# Patient Record
Sex: Female | Born: 2000 | Race: Black or African American | Hispanic: No | Marital: Single | Smoking: Current some day smoker
Health system: Southern US, Community
[De-identification: ages and names within clinical notes are randomized; demographics above are authoritative.]

## PROBLEM LIST (undated history)

## (undated) DIAGNOSIS — J45909 Unspecified asthma, uncomplicated: Secondary | ICD-10-CM

## (undated) HISTORY — PX: NO PAST SURGERIES: SHX2092

---

## 2020-06-06 ENCOUNTER — Other Ambulatory Visit: Payer: Self-pay

## 2020-06-06 ENCOUNTER — Encounter: Payer: Self-pay | Admitting: Emergency Medicine

## 2020-06-06 ENCOUNTER — Ambulatory Visit
Admission: EM | Admit: 2020-06-06 | Discharge: 2020-06-06 | Disposition: A | Discharge status: Home / Self Care | Payer: Medicaid Other | Attending: Internal Medicine | Admitting: Internal Medicine

## 2020-06-06 DIAGNOSIS — N912 Amenorrhea, unspecified: Secondary | ICD-10-CM | POA: Insufficient documentation

## 2020-06-06 LAB — PREGNANCY, URINE: Preg Test, Ur: POSITIVE — AB

## 2020-06-06 NOTE — ED Provider Notes (Signed)
[facility]    CSN: [id number] Arrival date & time: 06/06/20  1714      History   Chief Complaint Chief Complaint  Patient presents with  . Amenorrhea    HPI Gina Johnson is a 19 y.o. female who present due to missing her cycle this week. She things she had her last period 8/30th. Has not been doing anything to prevent pregnancy. Did plan B 5 days ago, but has not bled at all.     History reviewed. No pertinent past medical history.  There are no problems to display for this patient.   Past Surgical History:  Procedure Laterality Date  . NO PAST SURGERIES      OB History   No obstetric history on file.      Home Medications    Prior to Admission medications   Not on File    Family History Family History  Problem Relation Age of Onset  . Diabetes Mother   . Hypertension Mother   . Asthma Mother   . Healthy Father     Social History Social History   Tobacco Use  . Smoking status: Current Some Day Smoker  . Smokeless tobacco: Never Used  Vaping Use  . Vaping Use: Never used  Substance Use Topics  . Alcohol use: Yes  . Drug use: Yes    Types: Marijuana     Allergies   Patient has no known allergies.   Review of Systems Review of Systems   Physical Exam Triage Vital Signs ED Triage Vitals  Enc Vitals Group     BP 06/06/20 1748 107/68     Pulse Rate 06/06/20 1748 79     Resp 06/06/20 1748 18     Temp 06/06/20 1748 98.8 F (37.1 C)     Temp Source 06/06/20 1748 Oral     SpO2 06/06/20 1748 98 %     Weight 06/06/20 1745 230 lb (104.3 kg)     Height 06/06/20 1745 5\' 3"  (1.6 m)     Head Circumference --      Peak Flow --      Pain Score 06/06/20 1745 0     Pain Loc --      Pain Edu? --      Excl. in GC? --    No data found.  Updated Vital Signs BP 107/68 (BP Location: Left Arm)   Pulse 79   Temp 98.8 F (37.1 C) (Oral)   Resp 18   Ht 5\' 3"  (1.6 m)   Wt 230 lb (104.3 kg)   LMP 05/06/2020 (Approximate)   SpO2  98%   BMI 40.74 kg/m   Visual Acuity Right Eye Distance:   Left Eye Distance:   Bilateral Distance:    Right Eye Near:   Left Eye Near:    Bilateral Near:     Physical Exam   UC Treatments / Results  Labs (all labs ordered are listed, but only abnormal results are displayed) Labs Reviewed  PREGNANCY, URINE - Abnormal; Notable for the following components:      Result Value   Preg Test, Ur POSITIVE (*)    All other components within normal limits    EKG   Radiology No results found.  Procedures Procedures (including critical care time)  Medications Ordered in UC Medications - No data to display  Initial Impression / Assessment and Plan / UC Course  I have reviewed the triage vital signs and the nursing notes.  Pertinent  labs & imaging results that were available during my care of the patient were reviewed by me and considered in my medical decision making (see chart for details).      Final Clinical Impressions(s) / UC Diagnoses   Final diagnoses:  None   Discharge Instructions   None    ED Prescriptions    None     PDMP not reviewed this encounter.   Garey Ham, [place] 06/06/20 1837

## 2020-06-06 NOTE — Discharge Instructions (Addendum)
You are possibly 4 1/[redacted] weeks pregnant and your due date is February 10 2021.  Get yourself prenatal vitamins.  Make an appointment with your OB Dr for check up.

## 2020-06-06 NOTE — ED Triage Notes (Signed)
Pt states her period was suppose to start this week but she has not started. She states she also took a plan B less than a week ago. She has not taken any pregnancy test at home. She has had back pain, nausea and diarrhea. She states she had these same symptoms with her baby.

## 2020-08-11 ENCOUNTER — Other Ambulatory Visit: Payer: Self-pay

## 2020-08-11 ENCOUNTER — Emergency Department
Admission: EM | Admit: 2020-08-11 | Discharge: 2020-08-11 | Disposition: A | Discharge status: Home / Self Care | Payer: Medicaid Other | Attending: Emergency Medicine | Admitting: Emergency Medicine

## 2020-08-11 ENCOUNTER — Emergency Department: Payer: Medicaid Other

## 2020-08-11 DIAGNOSIS — J069 Acute upper respiratory infection, unspecified: Secondary | ICD-10-CM | POA: Insufficient documentation

## 2020-08-11 DIAGNOSIS — F17200 Nicotine dependence, unspecified, uncomplicated: Secondary | ICD-10-CM | POA: Diagnosis not present

## 2020-08-11 DIAGNOSIS — Z3A12 12 weeks gestation of pregnancy: Secondary | ICD-10-CM | POA: Diagnosis not present

## 2020-08-11 DIAGNOSIS — Z20822 Contact with and (suspected) exposure to covid-19: Secondary | ICD-10-CM | POA: Diagnosis not present

## 2020-08-11 DIAGNOSIS — J45909 Unspecified asthma, uncomplicated: Secondary | ICD-10-CM | POA: Insufficient documentation

## 2020-08-11 DIAGNOSIS — A5901 Trichomonal vulvovaginitis: Secondary | ICD-10-CM | POA: Insufficient documentation

## 2020-08-11 DIAGNOSIS — O99511 Diseases of the respiratory system complicating pregnancy, first trimester: Secondary | ICD-10-CM | POA: Diagnosis not present

## 2020-08-11 DIAGNOSIS — O23591 Infection of other part of genital tract in pregnancy, first trimester: Secondary | ICD-10-CM | POA: Insufficient documentation

## 2020-08-11 HISTORY — DX: Unspecified asthma, uncomplicated: J45.909

## 2020-08-11 LAB — URINALYSIS, COMPLETE (UACMP) WITH MICROSCOPIC
Bilirubin Urine: NEGATIVE
Glucose, UA: NEGATIVE mg/dL
Hgb urine dipstick: NEGATIVE
Ketones, ur: NEGATIVE mg/dL
Leukocytes,Ua: NEGATIVE
Nitrite: NEGATIVE
Protein, ur: NEGATIVE mg/dL
Specific Gravity, Urine: 1.03 (ref 1.005–1.030)
pH: 5 (ref 5.0–8.0)

## 2020-08-11 LAB — GROUP A STREP BY PCR: Group A Strep by PCR: NOT DETECTED

## 2020-08-11 LAB — WET PREP, GENITAL
Clue Cells Wet Prep HPF POC: NONE SEEN
Sperm: NONE SEEN
Yeast Wet Prep HPF POC: NONE SEEN

## 2020-08-11 LAB — COMPREHENSIVE METABOLIC PANEL
ALT: 14 U/L (ref 0–44)
AST: 15 U/L (ref 15–41)
Albumin: 3.6 g/dL (ref 3.5–5.0)
Alkaline Phosphatase: 81 U/L (ref 38–126)
Anion gap: 8 (ref 5–15)
BUN: 7 mg/dL (ref 6–20)
CO2: 21 mmol/L — ABNORMAL LOW (ref 22–32)
Calcium: 9.2 mg/dL (ref 8.9–10.3)
Chloride: 108 mmol/L (ref 98–111)
Creatinine, Ser: 0.56 mg/dL (ref 0.44–1.00)
GFR, Estimated: >60 mL/min (ref >60)
Glucose, Bld: 84 mg/dL (ref 70–99)
Potassium: 3.7 mmol/L (ref 3.5–5.1)
Sodium: 137 mmol/L (ref 135–145)
Total Bilirubin: 0.5 mg/dL (ref 0.3–1.2)
Total Protein: 7.7 g/dL (ref 6.5–8.1)

## 2020-08-11 LAB — CBC WITH DIFFERENTIAL/PLATELET
Abs Immature Granulocytes: 0.02 10*3/uL (ref 0.00–0.07)
Basophils Absolute: 0 10*3/uL (ref 0.0–0.1)
Basophils Relative: 1 %
Eosinophils Absolute: 0.3 10*3/uL (ref 0.0–0.5)
Eosinophils Relative: 4 %
HCT: 34.5 % — ABNORMAL LOW (ref 36.0–46.0)
Hemoglobin: 11.7 g/dL — ABNORMAL LOW (ref 12.0–15.0)
Immature Granulocytes: 0 %
Lymphocytes Relative: 18 %
Lymphs Abs: 1.6 10*3/uL (ref 0.7–4.0)
MCH: 30.1 pg (ref 26.0–34.0)
MCHC: 33.9 g/dL (ref 30.0–36.0)
MCV: 88.7 fL (ref 80.0–100.0)
Monocytes Absolute: 0.5 10*3/uL (ref 0.1–1.0)
Monocytes Relative: 6 %
Neutro Abs: 6 10*3/uL (ref 1.7–7.7)
Neutrophils Relative %: 71 %
Platelets: 374 10*3/uL (ref 150–400)
RBC: 3.89 MIL/uL (ref 3.87–5.11)
RDW: 12.3 % (ref 11.5–15.5)
WBC: 8.5 10*3/uL (ref 4.0–10.5)
nRBC: 0 % (ref 0.0–0.2)

## 2020-08-11 LAB — RESP PANEL BY RT-PCR (FLU A&B, COVID) ARPGX2
Influenza A by PCR: NEGATIVE
Influenza B by PCR: NEGATIVE
SARS Coronavirus 2 by RT PCR: NEGATIVE

## 2020-08-11 LAB — HCG, QUANTITATIVE, PREGNANCY: hCG, Beta Chain, Quant, S: 51637 m[IU]/mL — ABNORMAL HIGH (ref <5)

## 2020-08-11 MED ORDER — METRONIDAZOLE 500 MG PO TABS
500.0000 mg | ORAL_TABLET | Freq: Once | ORAL | Status: AC
  Administered 2020-08-11: 500 mg via ORAL
  Filled 2020-08-11: qty 1

## 2020-08-11 MED ORDER — METRONIDAZOLE 500 MG PO TABS: 500.0000 mg | ORAL_TABLET | Freq: Two times a day (BID) | ORAL | 0 refills | Status: AC

## 2020-08-11 NOTE — ED Triage Notes (Signed)
Pt states that she is about [redacted] weeks pregnant and had a pap smear and has since been having some abdominal cramping and yellowish discharge- pt states she also has flu like symptoms which she states she got from her son- pt states she has had a negative covid test- pt also c/o a stye on her right eye

## 2020-08-11 NOTE — ED Notes (Signed)
Patient is pregnant, due February 23, 2020

## 2020-08-11 NOTE — ED Provider Notes (Signed)
[facility] Emergency Department Provider Note  ___________________________________________   First MD Initiated Contact with Patient 08/11/20 1728     (approximate)  I have reviewed the triage vital signs and the nursing notes.   HISTORY  Chief Complaint Vaginal Discharge  HPI Gina Johnson is a 19 y.o. female , G2 P1 ,who presents to the emergency department for evaluation of vaginal discharge.  The patient states that she is [redacted] weeks pregnant and recently had a Pap smear and since that time has been having some abdominal cramping with some yellowish discharge.  She also states that she has been feeling some nasal congestion, sore throat and feeling very rundown with body aches.  She denies fever, cough or shortness of breath.  She states that when she had the Pap smear for her pregnancy, she was also tested for STDs so she is unsure why she is having these symptoms.  She describes the discharge as thick and a white/yellow.  She denies any bloody discharge, sudden gush of fluids.  She denies any known complications to this pregnancy.          Past Medical History:  Diagnosis Date  . Asthma     There are no problems to display for this patient.   Past Surgical History:  Procedure Laterality Date  . NO PAST SURGERIES      Prior to Admission medications   Medication Sig Start Date End Date Taking? Authorizing Provider  metroNIDAZOLE (FLAGYL) 500 MG tablet Take 1 tablet (500 mg total) by mouth 2 (two) times daily for 7 days. 08/11/20 08/18/20  Lucy Chris, PA    Allergies Patient has no known allergies.  Family History  Problem Relation Age of Onset  . Diabetes Mother   . Hypertension Mother   . Asthma Mother   . Healthy Father     Social History Social History   Tobacco Use  . Smoking status: Current Some Day Smoker  . Smokeless tobacco: Never Used  Vaping Use  . Vaping Use: Never used  Substance Use Topics  . Alcohol use:  Yes  . Drug use: Yes    Types: Marijuana    Review of Systems Constitutional: + Body aches, no fever/chills Eyes: No visual changes. ENT: + sore throat. + Nasal congestion Cardiovascular: Denies chest pain. Respiratory: Denies shortness of breath. Gastrointestinal: No abdominal pain.  No nausea, no vomiting.  No diarrhea.  No constipation. Genitourinary: + Vaginal discharge, negative for dysuria. Musculoskeletal: Negative for back pain. Skin: Negative for rash. Neurological: Negative for headaches, focal weakness or numbness.   ____________________________________________   PHYSICAL EXAM:  VITAL SIGNS: ED Triage Vitals  Enc Vitals Group     BP 08/11/20 1510 131/66     Pulse Rate 08/11/20 1510 86     Resp 08/11/20 1510 18     Temp 08/11/20 1510 98.2 F (36.8 C)     Temp Source 08/11/20 1510 Oral     SpO2 08/11/20 1510 99 %     Weight 08/11/20 1509 229 lb (103.9 kg)     Height 08/11/20 1509 5\' 3"  (1.6 m)     Head Circumference --      Peak Flow --      Pain Score 08/11/20 1508 8     Pain Loc --      Pain Edu? --      Excl. in GC? --     Constitutional: Alert and oriented. Well appearing and in no acute  distress. Eyes: Conjunctivae are normal. PERRL. EOMI. Head: Atraumatic. Nose: Mild congestion/rhinnorhea. Mouth/Throat: Mucous membranes are moist.  Oropharynx erythematous without any tonsillar exudate or enlargement. Neck: No stridor.   Lymphatic: Mild bilateral anterior cervical lymphadenopathy. Cardiovascular: Normal rate, regular rhythm. Grossly normal heart sounds.  Good peripheral circulation. Respiratory: Normal respiratory effort.  No retractions. Lungs CTAB. Gastrointestinal: Soft and nontender. No distention. No abdominal bruits. No CVA tenderness. Genitourinary: Deferred. Musculoskeletal: No lower extremity tenderness nor edema.  No joint effusions. Neurologic:  Normal speech and language. No gross focal neurologic deficits are appreciated. No gait  instability. Skin:  Skin is warm, dry and intact. No rash noted. Psychiatric: Mood and affect are normal. Speech and behavior are normal.  ____________________________________________   LABS (all labs ordered are listed, but only abnormal results are displayed)  Labs Reviewed  WET PREP, GENITAL - Abnormal; Notable for the following components:      Result Value   Trich, Wet Prep PRESENT (*)    WBC, Wet Prep HPF POC FEW (*)    All other components within normal limits  CBC WITH DIFFERENTIAL/PLATELET - Abnormal; Notable for the following components:   Hemoglobin 11.7 (*)    HCT 34.5 (*)    All other components within normal limits  COMPREHENSIVE METABOLIC PANEL - Abnormal; Notable for the following components:   CO2 21 (*)    All other components within normal limits  URINALYSIS, COMPLETE (UACMP) WITH MICROSCOPIC - Abnormal; Notable for the following components:   Color, Urine YELLOW (*)    APPearance HAZY (*)    Bacteria, UA RARE (*)    All other components within normal limits  HCG, QUANTITATIVE, PREGNANCY - Abnormal; Notable for the following components:   hCG, Beta Chain, Quant, S 51,637 (*)    All other components within normal limits  RESP PANEL BY RT-PCR (FLU A&B, COVID) [id number]  GROUP A STREP BY PCR  URINE CULTURE    ____________________________________________  RADIOLOGY  Official radiology report(s): US OB Comp Less 14 Wks  Result Date: 08/11/2020 CLINICAL DATA:  Initial evaluation for abdominal cramping, early pregnancy. EXAM: OBSTETRIC <14 WK ULTRASOUND TECHNIQUE: Transabdominal ultrasound was performed for evaluation of the gestation as well as the maternal uterus and adnexal regions. COMPARISON:  None. FINDINGS: Intrauterine gestational sac: Single Yolk sac:  Negative. Embryo:  Present. Cardiac Activity: Present. Heart Rate: 155 bpm CRL: 71.2 mm   13 w 2 d                  [place] EDC: 02/14/2021 Subchorionic hemorrhage:  None visualized. Maternal uterus/adnexae: Right  ovary within normal limits. Left ovary not visualized. No adnexal mass or free fluid. IMPRESSION: 1. Single viable intrauterine pregnancy as above, estimated gestational age [redacted] weeks and 2 days by crown-rump length, with ultrasound EDC of 02/14/2021. No complication. 2. No other acute maternal uterine or adnexal abnormality. Electronically Signed   By: Rise Mu M.D.   On: 08/11/2020 18:51    ____________________________________________   INITIAL IMPRESSION / ASSESSMENT AND PLAN / ED COURSE  As part of my medical decision making, I reviewed the following data within the electronic MEDICAL RECORD NUMBER Nursing notes reviewed and incorporated        Patient is a 19 year old G2, P1 who presents to the emergency department for evaluation of vaginal discharge that she feels has been worse since her recent Pap smear and OB work-up in the first trimester.  She complains of some mild abdominal cramping that has been present  since the time of the Pap smear.  She denies any severe abdominal pain, fever, nausea vomiting or diarrhea.  She is also having some nasal congestion and body aches.  Physical exam does show some mild nasal congestion erythematous oropharynx as well as some shotty bilateral cervical lymphadenopathy.  Work-up in the emergency room consisted of a respiratory panel, group A strep throat swab, genital wet prep, CMP, CBC and urinalysis.  CBC, CMP and urinalysis are grossly unremarkable.  The patient's respiratory panel and group A strep are negative.  Beta hCG quantitative is consistent with how far along she believes she is.  The genital wet prep did show the presence of trichomoniasis.  Given her abdominal cramping, pelvic ultrasound was ordered and no abnormal obstetric findings were identified.  Will treat the patient with metronidazole 500 twice daily x7 days per pregnancy recommendations.  We will also recommend that the patient follow-up closely with her OB.  The patient is amenable  with this plan she is stable at this time for outpatient therapy.      ____________________________________________   FINAL CLINICAL IMPRESSION(S) / ED DIAGNOSES  Final diagnoses:  Trichomonal vaginitis during pregnancy in first trimester  Viral URI     ED Discharge Orders         Ordered    metroNIDAZOLE (FLAGYL) 500 MG tablet  2 times daily        08/11/20 2103          *Please note:  Kyeisha P Zwart was evaluated in Emergency Department on 08/11/2020 for the symptoms described in the history of present illness. She was evaluated in the context of the global COVID-19 pandemic, which necessitated consideration that the patient might be at risk for infection with the SARS-CoV-2 virus that causes COVID-19. Institutional protocols and algorithms that pertain to the evaluation of patients at risk for COVID-19 are in a state of rapid change based on information released by regulatory bodies including the CDC and federal and state organizations. These policies and algorithms were followed during the patient's care in the ED.  Some ED evaluations and interventions may be delayed as a result of limited staffing during and the pandemic.*   Note:  This document was prepared using Dragon voice recognition software and may include unintentional dictation errors.    Lucy Chris, PA 08/11/20 2340    Phineas Semen, MD 08/19/20 [phone]

## 2020-08-11 NOTE — ED Notes (Signed)
Pt given swab to self swab for wet prep

## 2020-08-13 LAB — URINE CULTURE: Culture: <10000 — AB

## 2021-08-10 IMAGING — US US OB COMP LESS 14 WK
1 series · 14 of 28 positions shown · non-contrast
Comparison: None.

CLINICAL DATA: Initial evaluation for abdominal cramping, early
pregnancy.

EXAM:
OBSTETRIC <14 WK ULTRASOUND
TECHNIQUE: Transabdominal ultrasound was performed for evaluation of the
gestation as well as the maternal uterus and adnexal regions.

[Series 1: us ob comp less 14 wks · 14 of 48 slices shown]
[im 2/48]
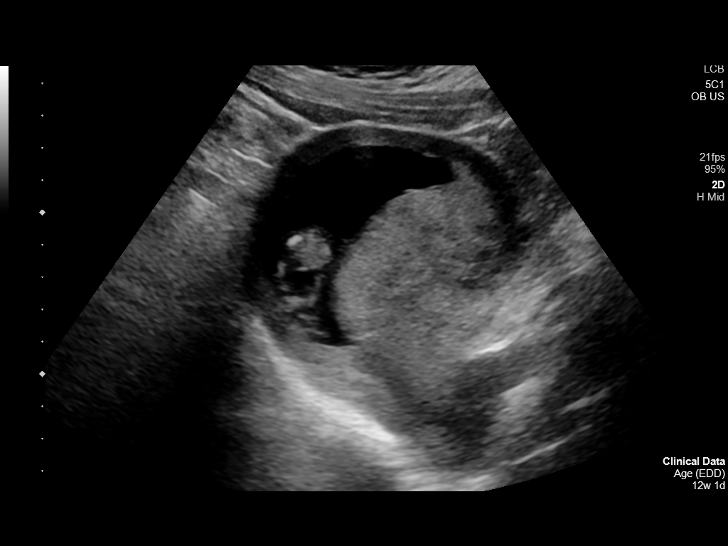
[im 6/48]
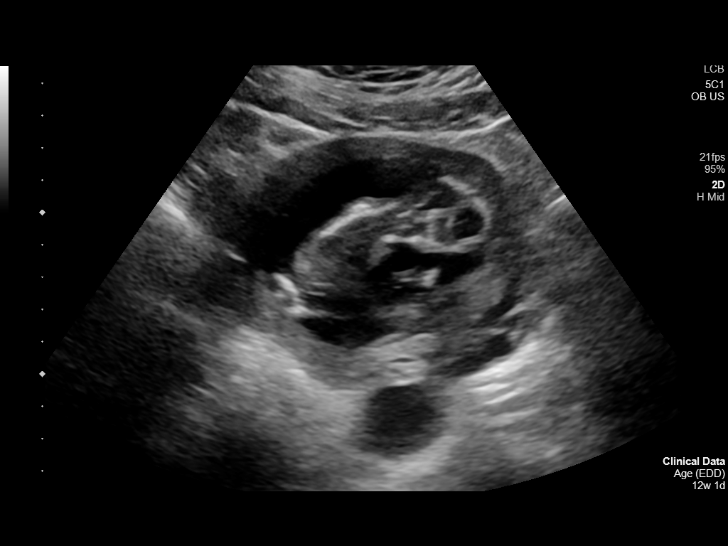
[im 9/48]
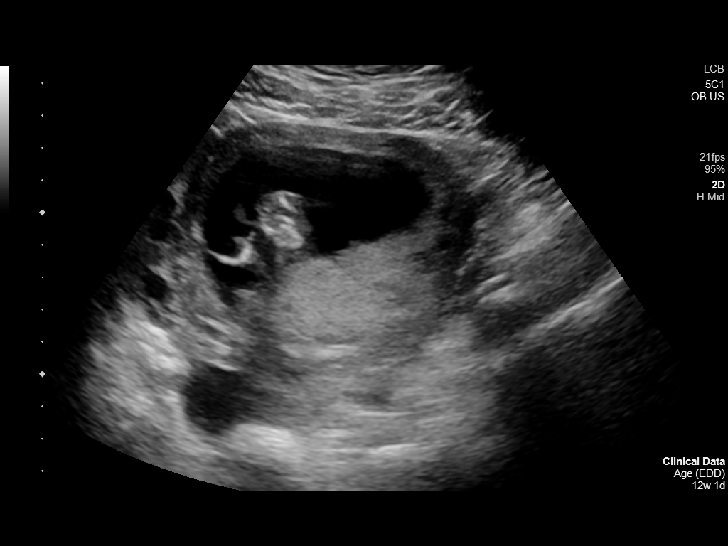
[im 13/48]
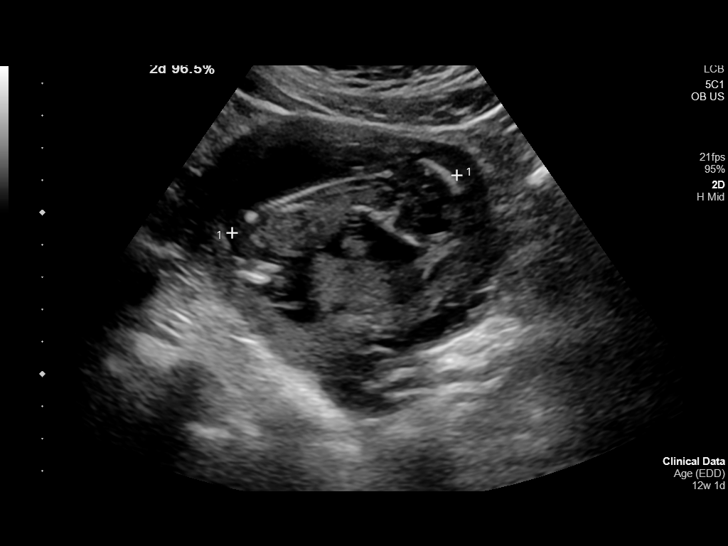
[im 16/48]
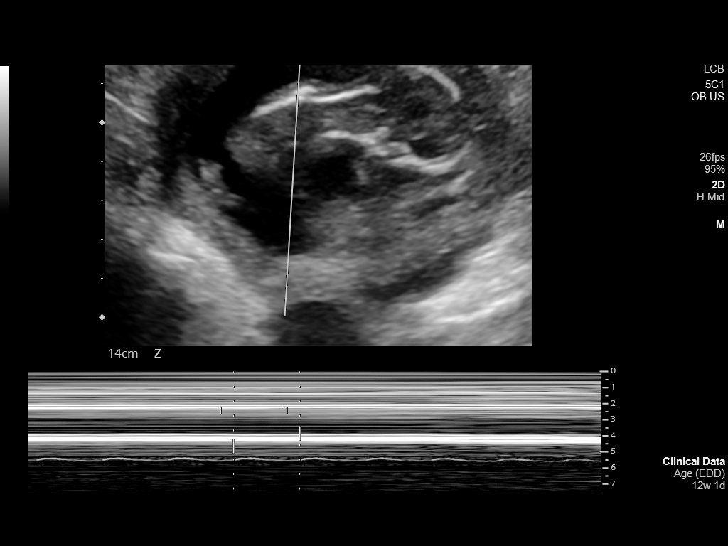
[im 20/48]
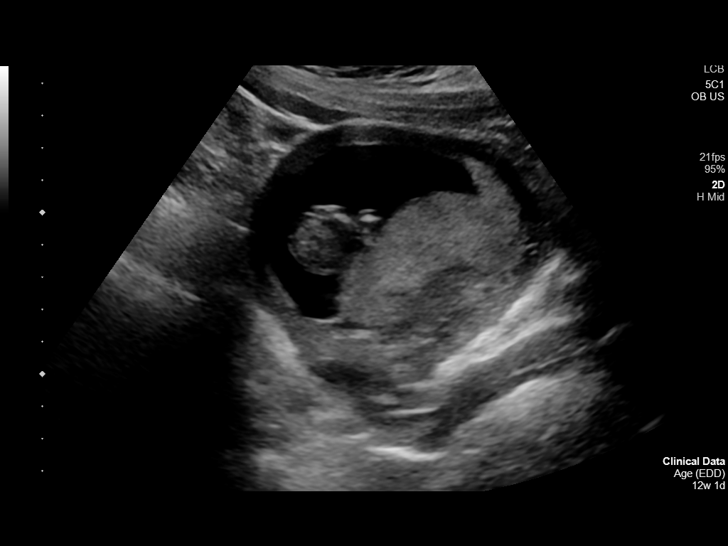
[im 23/48]
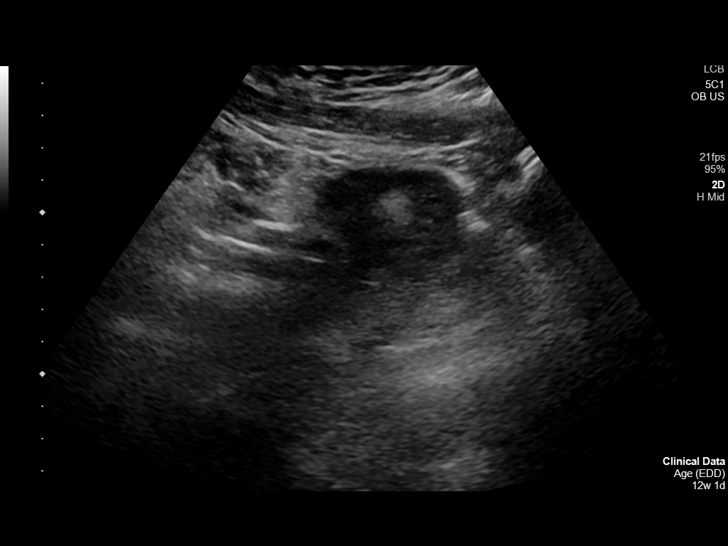
[im 27/48]
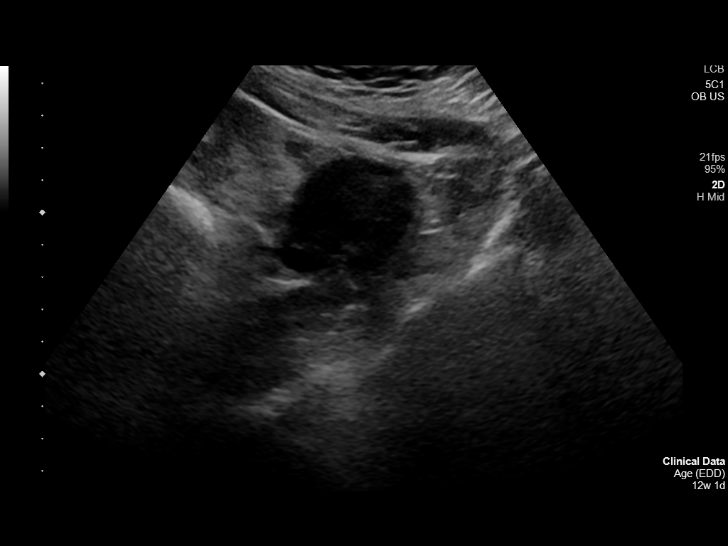
[im 30/48]
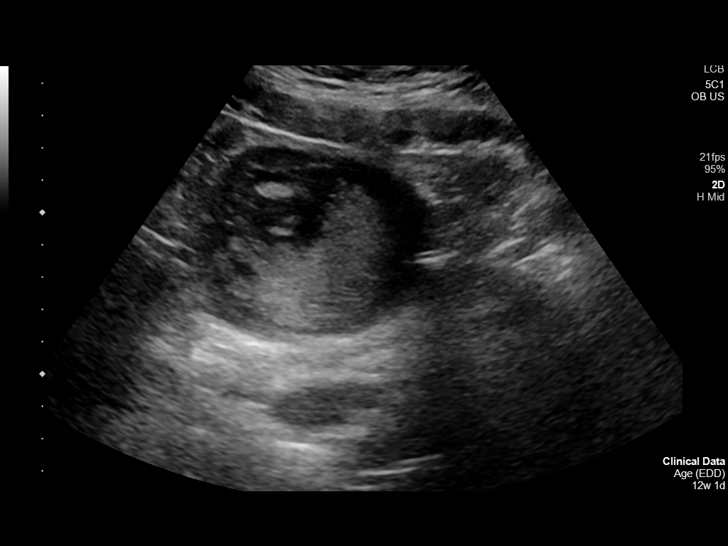
[im 34/48]
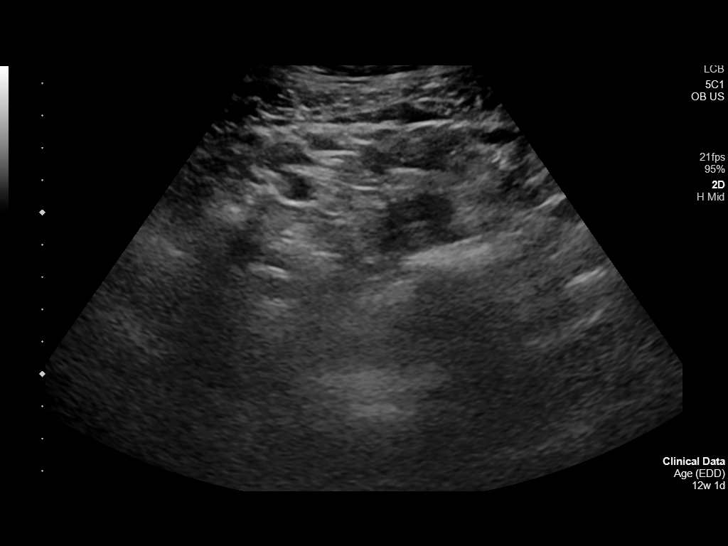
[im 37/48]
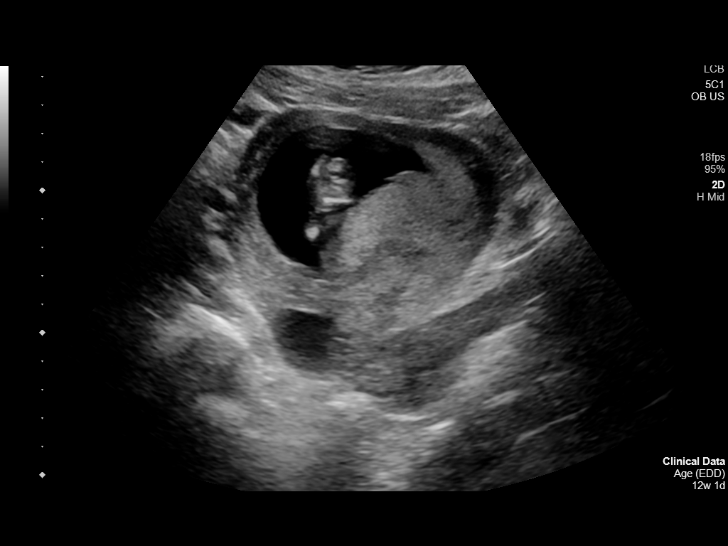
[im 41/48]
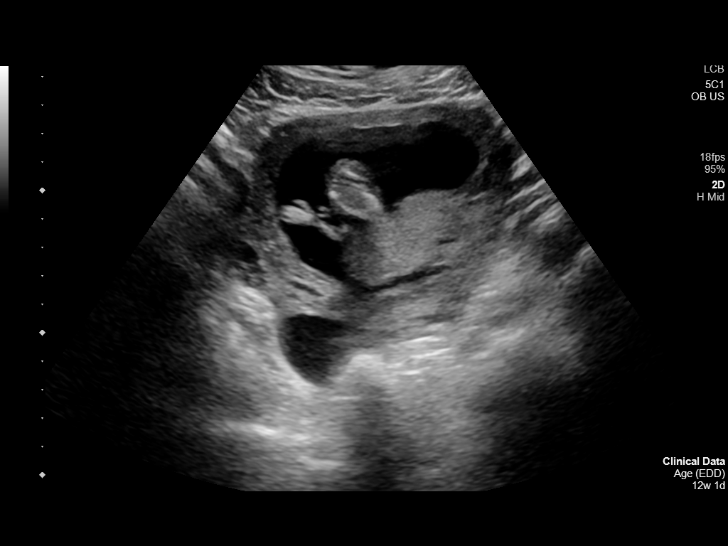
[im 44/48]
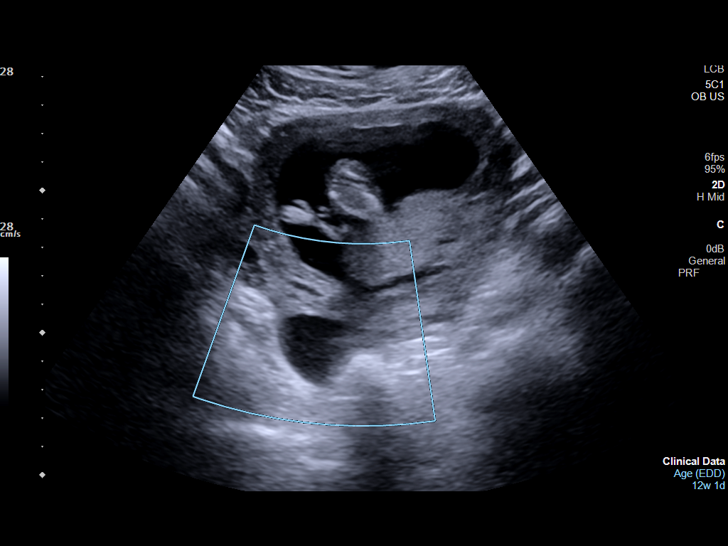
[im 48/48]
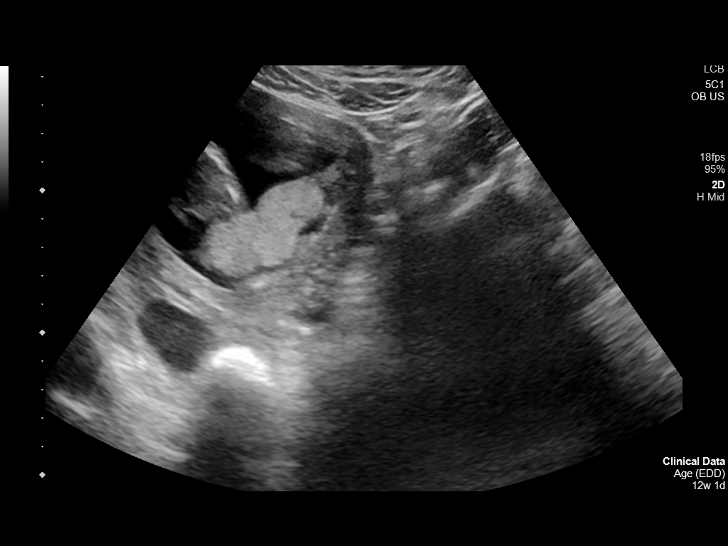

[14 of 28 positions shown; findings below may reference images not displayed]

FINDINGS: Intrauterine gestational sac: Single

Yolk sac:  Negative.

Embryo:  Present.

Cardiac Activity: Present.

Heart Rate: 155 bpm

CRL: 71.2 mm   13 w 2 d                  [place] EDC: 02/14/2021

Subchorionic hemorrhage:  None visualized.

Maternal uterus/adnexae: Right ovary within normal limits. Left
ovary not visualized. No adnexal mass or free fluid.
IMPRESSION: 1. Single viable intrauterine pregnancy as above, estimated
gestational age 13 weeks and 2 days by crown-rump length, with
ultrasound EDC of 02/14/2021. No complication.
2. No other acute maternal uterine or adnexal abnormality.
